# Patient Record
Sex: Female | Born: 1970 | Race: White | Hispanic: No | Marital: Married | Smoking: Never smoker
Health system: Southern US, Community
[De-identification: ages and names within clinical notes are randomized; demographics above are authoritative.]

## PROBLEM LIST (undated history)

## (undated) DIAGNOSIS — G43909 Migraine, unspecified, not intractable, without status migrainosus: Secondary | ICD-10-CM

## (undated) HISTORY — DX: Migraine, unspecified, not intractable, without status migrainosus: G43.909

---

## 1999-09-01 ENCOUNTER — Emergency Department (HOSPITAL_COMMUNITY): Admission: EM | Admit: 1999-09-01 | Discharge: 1999-09-01 | Payer: Self-pay | Admitting: Emergency Medicine

## 1999-09-05 ENCOUNTER — Encounter: Payer: Self-pay | Admitting: Family Medicine

## 1999-09-05 ENCOUNTER — Encounter: Admission: RE | Admit: 1999-09-05 | Discharge: 1999-09-05 | Payer: Self-pay | Admitting: Family Medicine

## 2000-07-29 ENCOUNTER — Other Ambulatory Visit: Admission: RE | Admit: 2000-07-29 | Discharge: 2000-07-29 | Payer: Self-pay | Admitting: Obstetrics & Gynecology

## 2000-09-17 ENCOUNTER — Other Ambulatory Visit: Admission: RE | Admit: 2000-09-17 | Discharge: 2000-09-17 | Payer: Self-pay | Admitting: *Deleted

## 2000-09-17 ENCOUNTER — Encounter (INDEPENDENT_AMBULATORY_CARE_PROVIDER_SITE_OTHER): Payer: Self-pay

## 2000-09-21 HISTORY — PX: LEEP: SHX91

## 2000-10-19 ENCOUNTER — Other Ambulatory Visit: Admission: RE | Admit: 2000-10-19 | Discharge: 2000-10-19 | Payer: Self-pay | Admitting: Obstetrics & Gynecology

## 2000-10-19 ENCOUNTER — Encounter (INDEPENDENT_AMBULATORY_CARE_PROVIDER_SITE_OTHER): Payer: Self-pay | Admitting: Specialist

## 2001-02-16 ENCOUNTER — Other Ambulatory Visit: Admission: RE | Admit: 2001-02-16 | Discharge: 2001-02-16 | Payer: Self-pay | Admitting: Obstetrics & Gynecology

## 2001-03-14 ENCOUNTER — Emergency Department (HOSPITAL_COMMUNITY): Admission: EM | Admit: 2001-03-14 | Discharge: 2001-03-14 | Payer: Self-pay | Admitting: Emergency Medicine

## 2001-08-16 ENCOUNTER — Other Ambulatory Visit: Admission: RE | Admit: 2001-08-16 | Discharge: 2001-08-16 | Payer: Self-pay | Admitting: Obstetrics & Gynecology

## 2002-06-02 ENCOUNTER — Other Ambulatory Visit: Admission: RE | Admit: 2002-06-02 | Discharge: 2002-06-02 | Payer: Self-pay | Admitting: Obstetrics & Gynecology

## 2003-02-02 ENCOUNTER — Other Ambulatory Visit: Admission: RE | Admit: 2003-02-02 | Discharge: 2003-02-02 | Payer: Self-pay | Admitting: Obstetrics and Gynecology

## 2004-02-25 ENCOUNTER — Other Ambulatory Visit: Admission: RE | Admit: 2004-02-25 | Discharge: 2004-02-25 | Payer: Self-pay | Admitting: Obstetrics & Gynecology

## 2005-01-20 ENCOUNTER — Encounter: Admission: RE | Admit: 2005-01-20 | Discharge: 2005-01-20 | Payer: Self-pay | Admitting: Family Medicine

## 2007-10-07 ENCOUNTER — Inpatient Hospital Stay (HOSPITAL_COMMUNITY): Admission: AD | Admit: 2007-10-07 | Discharge: 2007-10-09 | Payer: Self-pay | Admitting: Obstetrics & Gynecology

## 2007-10-07 ENCOUNTER — Encounter (INDEPENDENT_AMBULATORY_CARE_PROVIDER_SITE_OTHER): Payer: Self-pay | Admitting: Obstetrics & Gynecology

## 2007-10-10 ENCOUNTER — Encounter: Admission: RE | Admit: 2007-10-10 | Discharge: 2007-11-09 | Payer: Self-pay | Admitting: Obstetrics & Gynecology

## 2007-11-10 ENCOUNTER — Encounter: Admission: RE | Admit: 2007-11-10 | Discharge: 2007-12-07 | Payer: Self-pay | Admitting: Obstetrics & Gynecology

## 2007-12-08 ENCOUNTER — Encounter: Admission: RE | Admit: 2007-12-08 | Discharge: 2008-01-07 | Payer: Self-pay | Admitting: Obstetrics & Gynecology

## 2008-01-08 ENCOUNTER — Encounter: Admission: RE | Admit: 2008-01-08 | Discharge: 2008-02-06 | Payer: Self-pay | Admitting: Obstetrics & Gynecology

## 2008-02-07 ENCOUNTER — Encounter: Admission: RE | Admit: 2008-02-07 | Discharge: 2008-02-16 | Payer: Self-pay | Admitting: Obstetrics & Gynecology

## 2008-07-20 ENCOUNTER — Encounter: Admission: RE | Admit: 2008-07-20 | Discharge: 2008-07-20 | Payer: Self-pay | Admitting: Family Medicine

## 2009-06-18 ENCOUNTER — Ambulatory Visit (HOSPITAL_COMMUNITY): Admission: RE | Admit: 2009-06-18 | Discharge: 2009-06-18 | Payer: Self-pay | Admitting: Obstetrics & Gynecology

## 2009-06-19 ENCOUNTER — Ambulatory Visit (HOSPITAL_COMMUNITY): Admission: RE | Admit: 2009-06-19 | Discharge: 2009-06-19 | Payer: Self-pay | Admitting: Obstetrics & Gynecology

## 2009-07-30 ENCOUNTER — Inpatient Hospital Stay (HOSPITAL_COMMUNITY): Admission: AD | Admit: 2009-07-30 | Discharge: 2009-08-01 | Payer: Self-pay | Admitting: Obstetrics & Gynecology

## 2010-10-21 ENCOUNTER — Other Ambulatory Visit: Payer: Self-pay | Admitting: Otolaryngology

## 2010-10-21 DIAGNOSIS — J329 Chronic sinusitis, unspecified: Secondary | ICD-10-CM

## 2010-10-22 ENCOUNTER — Other Ambulatory Visit: Payer: Self-pay

## 2010-10-23 ENCOUNTER — Other Ambulatory Visit: Payer: Self-pay

## 2010-10-23 ENCOUNTER — Ambulatory Visit
Admission: RE | Admit: 2010-10-23 | Discharge: 2010-10-23 | Disposition: A | Discharge status: Home / Self Care | Payer: BC Managed Care – PPO | Source: Ambulatory Visit | Attending: Otolaryngology | Admitting: Otolaryngology

## 2010-10-23 DIAGNOSIS — J329 Chronic sinusitis, unspecified: Secondary | ICD-10-CM

## 2010-12-24 LAB — CBC
Hemoglobin: 11.1 g/dL — ABNORMAL LOW (ref 12.0–15.0)
MCHC: 34.2 g/dL (ref 30.0–36.0)
MCV: 98.7 fL (ref 78.0–100.0)
Platelets: 142 10*3/uL — ABNORMAL LOW (ref 150–400)
Platelets: 149 10*3/uL — ABNORMAL LOW (ref 150–400)
RDW: 13.8 % (ref 11.5–15.5)
WBC: 12.1 10*3/uL — ABNORMAL HIGH (ref 4.0–10.5)
WBC: 12.4 10*3/uL — ABNORMAL HIGH (ref 4.0–10.5)

## 2010-12-24 LAB — RPR: RPR Ser Ql: NONREACTIVE

## 2011-06-12 LAB — CBC
HCT: 29.3 — ABNORMAL LOW
Hemoglobin: 10.2 — ABNORMAL LOW
MCHC: 34.8
MCV: 95.7
RBC: 4.16
RDW: 13.1
WBC: 16.2 — ABNORMAL HIGH

## 2011-06-12 LAB — COMPREHENSIVE METABOLIC PANEL
ALT: 12
AST: 21
Albumin: 3.3 — ABNORMAL LOW
Alkaline Phosphatase: 95
Chloride: 102
GFR calc Af Amer: >60
Potassium: 4.1
Total Bilirubin: 1.9 — ABNORMAL HIGH

## 2011-06-12 LAB — CCBB MATERNAL DONOR DRAW

## 2011-06-12 LAB — RPR: RPR Ser Ql: NONREACTIVE

## 2011-11-13 ENCOUNTER — Other Ambulatory Visit: Payer: Self-pay | Admitting: Family Medicine

## 2011-11-13 ENCOUNTER — Ambulatory Visit
Admission: RE | Admit: 2011-11-13 | Discharge: 2011-11-13 | Disposition: A | Discharge status: Home / Self Care | Payer: BC Managed Care – PPO | Source: Ambulatory Visit | Attending: Family Medicine | Admitting: Family Medicine

## 2011-11-13 DIAGNOSIS — R0602 Shortness of breath: Secondary | ICD-10-CM

## 2011-11-13 MED ORDER — IOHEXOL 350 MG/ML SOLN
100.0000 mL | Freq: Once | INTRAVENOUS | Status: AC | PRN
  Administered 2011-11-13: 100 mL via INTRAVENOUS

## 2013-03-02 ENCOUNTER — Other Ambulatory Visit: Payer: Self-pay | Admitting: Dermatology

## 2014-03-05 ENCOUNTER — Other Ambulatory Visit: Payer: Self-pay | Admitting: *Deleted

## 2014-03-05 DIAGNOSIS — R002 Palpitations: Secondary | ICD-10-CM

## 2014-03-07 ENCOUNTER — Other Ambulatory Visit: Payer: Self-pay | Admitting: *Deleted

## 2014-03-07 DIAGNOSIS — R002 Palpitations: Secondary | ICD-10-CM

## 2014-03-14 ENCOUNTER — Encounter: Payer: Self-pay | Admitting: *Deleted

## 2014-03-14 ENCOUNTER — Encounter (INDEPENDENT_AMBULATORY_CARE_PROVIDER_SITE_OTHER): Payer: BC Managed Care – PPO

## 2014-03-14 DIAGNOSIS — R002 Palpitations: Secondary | ICD-10-CM

## 2014-03-14 NOTE — Progress Notes (Signed)
Patient ID: Kristin Henderson, female   DOB: 02/06/1971, 43 y.o.   MRN: [id number] ECardio 24 hour holter monitor applied to patient.

## 2014-07-06 ENCOUNTER — Other Ambulatory Visit: Payer: Self-pay

## 2015-10-01 ENCOUNTER — Ambulatory Visit
Admission: RE | Admit: 2015-10-01 | Discharge: 2015-10-01 | Disposition: A | Discharge status: Home / Self Care | Payer: BLUE CROSS/BLUE SHIELD | Source: Ambulatory Visit | Attending: Family Medicine | Admitting: Family Medicine

## 2015-10-01 ENCOUNTER — Other Ambulatory Visit: Payer: Self-pay | Admitting: Family Medicine

## 2015-10-01 DIAGNOSIS — M25561 Pain in right knee: Secondary | ICD-10-CM

## 2015-10-01 DIAGNOSIS — M25562 Pain in left knee: Principal | ICD-10-CM

## 2017-08-03 IMAGING — CR DG KNEE 1-2V*L*
2 series · 2 of 2 positions shown · non-contrast
Comparison: None.

CLINICAL DATA: Chronic pain.  No known injury.  Initial evaluation.

EXAM:
LEFT KNEE - 1-2 VIEW

[w knee ap left]
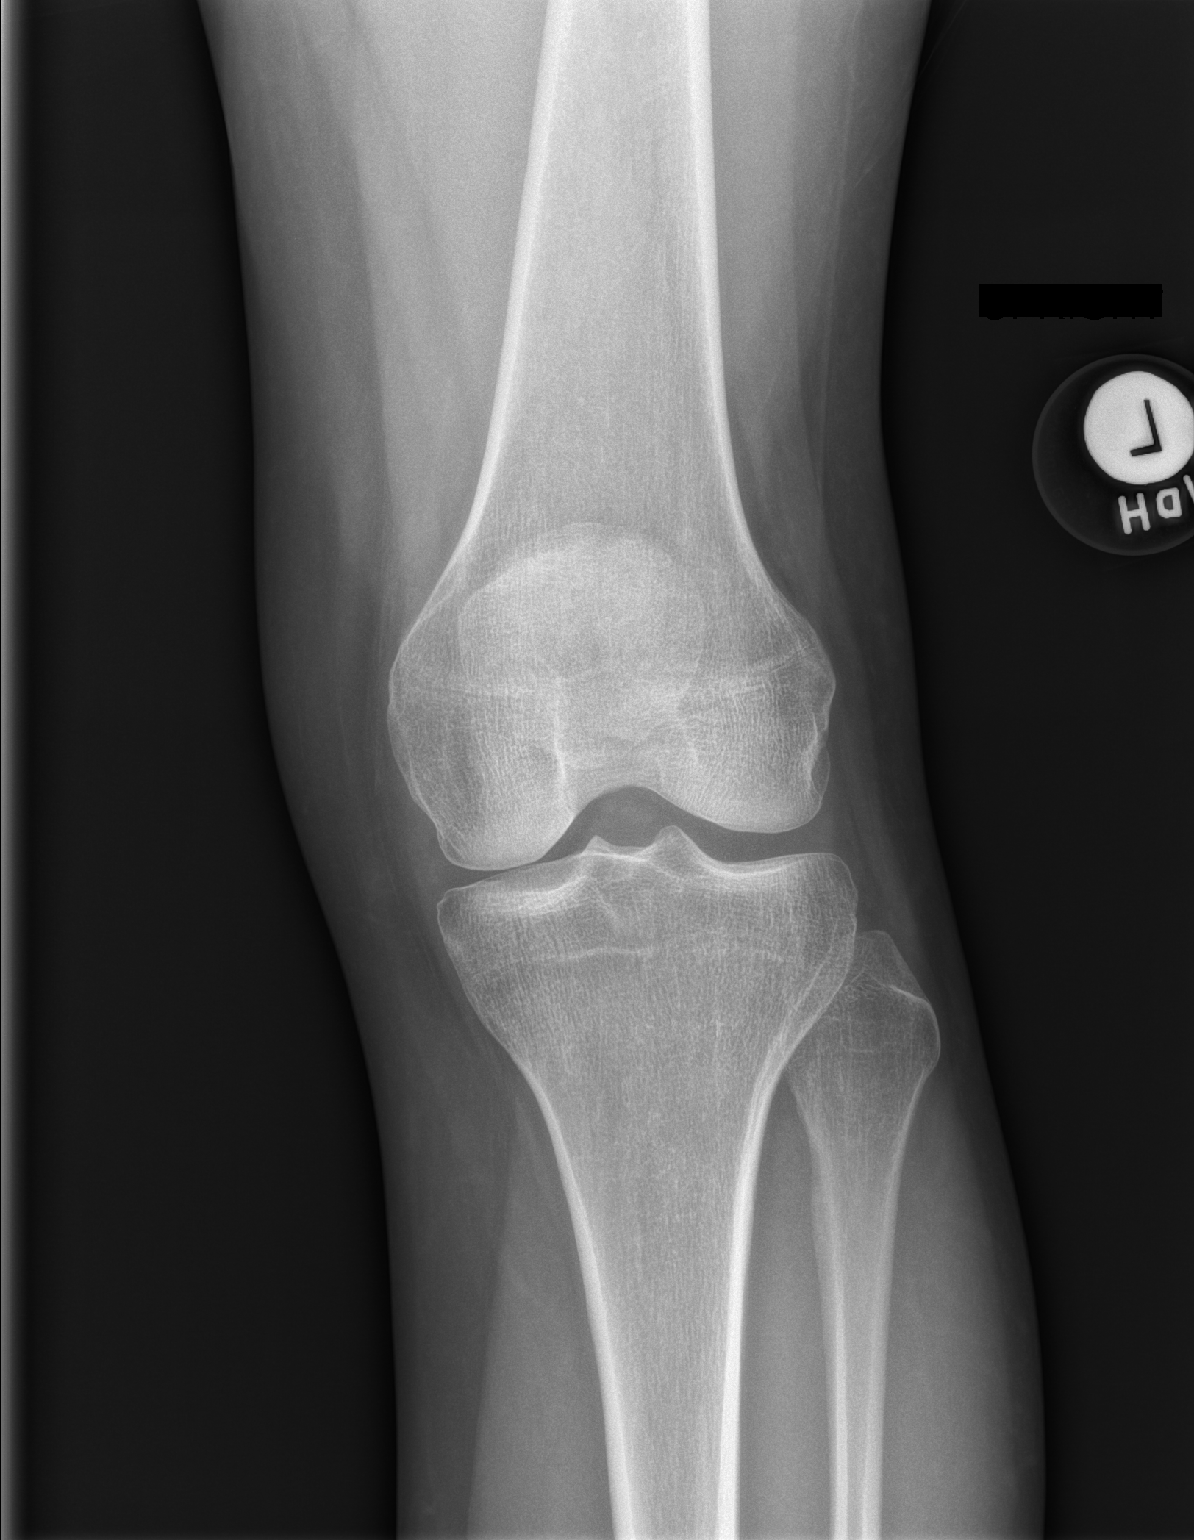

[w knee lat left]
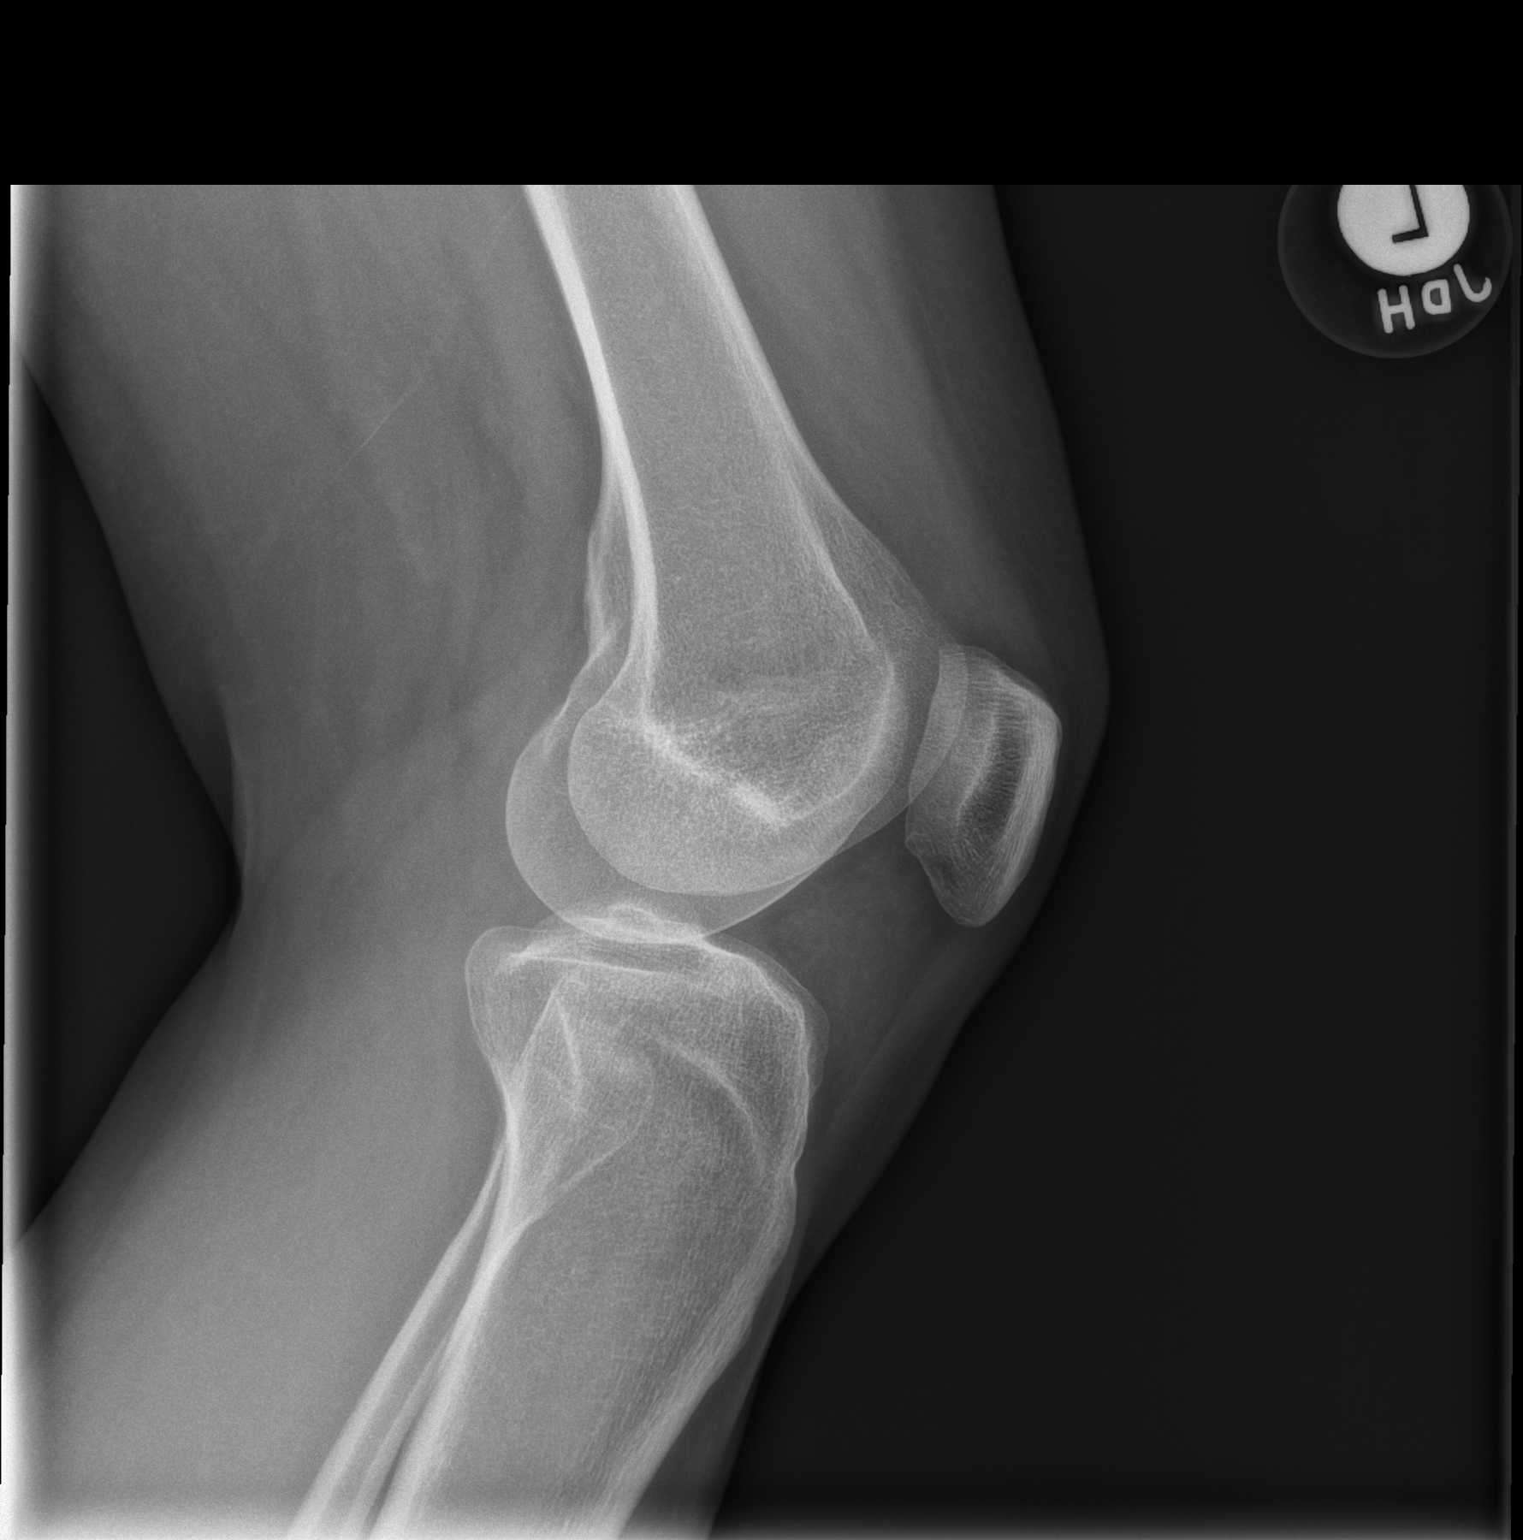

[2 of 2 positions shown; findings below may reference images not displayed]

FINDINGS: No acute bony or joint abnormality identified. No evidence of
fracture or dislocation.
IMPRESSION: No acute abnormality.

## 2018-11-03 ENCOUNTER — Encounter: Payer: Self-pay | Admitting: Neurology

## 2018-12-26 ENCOUNTER — Ambulatory Visit: Payer: BLUE CROSS/BLUE SHIELD | Admitting: Neurology

## 2019-03-06 ENCOUNTER — Other Ambulatory Visit (INDEPENDENT_AMBULATORY_CARE_PROVIDER_SITE_OTHER): Payer: BC Managed Care – PPO

## 2019-03-06 ENCOUNTER — Other Ambulatory Visit: Payer: Self-pay

## 2019-03-06 ENCOUNTER — Ambulatory Visit (INDEPENDENT_AMBULATORY_CARE_PROVIDER_SITE_OTHER): Payer: BC Managed Care – PPO | Admitting: Neurology

## 2019-03-06 ENCOUNTER — Encounter: Payer: Self-pay | Admitting: Neurology

## 2019-03-06 VITALS — BP 122/72 | HR 79 | Ht 68.0 in | Wt 135.0 lb

## 2019-03-06 DIAGNOSIS — G43009 Migraine without aura, not intractable, without status migrainosus: Secondary | ICD-10-CM

## 2019-03-06 DIAGNOSIS — G4485 Primary stabbing headache: Secondary | ICD-10-CM | POA: Diagnosis not present

## 2019-03-06 LAB — SEDIMENTATION RATE: Sed Rate: 6 mm/h (ref 0–20)

## 2019-03-06 NOTE — Progress Notes (Signed)
[facility] Clinic Note - Initial Visit   Date: 03/06/19  Kristin Henderson MRN: [id number] DOB: 1971/04/12   Dear Dr. Orland Mustard:  Thank you for your kind referral of Kristin Henderson for consultation of headaches. Although her history is well known to you, please allow [place] to reiterate it for the purpose of our medical record. The patient was accompanied to the clinic by self.   History of Present Illness: Kristin Henderson is a 48 y.o. right-handed female with history of migraines, GERD, and borderline hyperlipidemia presenting for evaluation of left-sided headaches. Starting in early 2020, she began having very transient sharp pain of the left temporal region.  This occurs 1-2 times per day and lasts a few seconds.  There is no associated nausea, vomiting, light sensitivity, eye pain, or vision changes. She has not identified any specific triggers.    She had migraines since the age of 61.  Intensity and frequency of migraines has improved over the past few years. Migraines usually occur about once per month, 1-2 days and alleviated by OTC medications.   Her mother and sister also have history of migraine.    Past Medical History:  Diagnosis Date  . Migraines     Past Surgical History:  Procedure Laterality Date  . LEEP  2002     Medications:  Outpatient Encounter Medications as of 03/06/2019  Medication Sig  . ferrous sulfate 325 (65 FE) MG tablet Take 325 mg by mouth daily with breakfast.  . Ibuprofen (ADVIL) 200 MG CAPS Take 200 mg by mouth as needed.  . Multiple Vitamin (MULTIVITAMIN) capsule Take 1 capsule by mouth daily.   No facility-administered encounter medications on file as of 03/06/2019.     Allergies: No Known Allergies  Family History: Family History  Problem Relation Age of Onset  . Ulcers Father   . Skin cancer Father   . Migraines Mother   . Migraines Sister     Social History: Social History   Tobacco Use  . Smoking  status: Never Smoker  . Smokeless tobacco: Never Used  Substance Use Topics  . Alcohol use: Yes    Comment: rare  . Drug use: Never   Social History   Social History Corporate investment banker      Lives with husband and two kids      No steps in home      Highest level of edu- [employer]       Review of Systems:  CONSTITUTIONAL: No fevers, chills, night sweats, or weight loss.   EYES: No visual changes or eye pain ENT: No hearing changes.  No history of nose bleeds.   RESPIRATORY: No cough, wheezing and shortness of breath.   CARDIOVASCULAR: Negative for chest pain, and palpitations.   GI: Negative for abdominal discomfort, blood in stools or black stools.  No recent change in bowel habits.   GU:  No history of incontinence.   MUSCLOSKELETAL: No history of joint pain or swelling.  No myalgias.   SKIN: Negative for lesions, rash, and itching.   HEMATOLOGY/ONCOLOGY: Negative for prolonged bleeding, bruising easily, and swollen nodes.  No history of cancer.   ENDOCRINE: Negative for cold or heat intolerance, polydipsia or goiter.   PSYCH:  No depression or anxiety symptoms.   NEURO: As Above.   Vital Signs:  BP 122/72   Pulse 79   Ht 5' 8"  (1.727 m)   Wt 135 lb (61.2 kg)   SpO2 99%  BMI 20.53 kg/m    General Medical Exam:   General:  Well appearing, comfortable.   Eyes/ENT: see cranial nerve examination.  No temporal tenderness or ropiness of the temporal artery bilaterally. Neck:   No carotid bruits. Respiratory:  Clear to auscultation, good air entry bilaterally.   Cardiac:  Regular rate and rhythm, no murmur.   Extremities:  No deformities, edema, or skin discoloration.  Skin:  No rashes or lesions.  Neurological Exam: MENTAL STATUS including orientation to time, place, person, recent and remote memory, attention span and concentration, language, and fund of knowledge is normal.  Speech is not dysarthric.  CRANIAL NERVES: II:  No visual field  defects.  Unremarkable fundi.   III-IV-VI: Pupils equal round and reactive to light.  Normal conjugate, extra-ocular eye movements in all directions of gaze.  No nystagmus.  No ptosis.   V:  Normal facial sensation.    VII:  Normal facial symmetry and movements.   VIII:  Normal hearing and vestibular function.   IX-X:  Normal palatal movement.   XI:  Normal shoulder shrug and head rotation.   XII:  Normal tongue strength and range of motion, no deviation or fasciculation.  MOTOR:  Motor strength is 5/5 throughout. No atrophy, fasciculations or abnormal movements.  No pronator drift.   MSRs:  Right        Left                  brachioradialis 2+  2+  biceps 2+  2+  triceps 2+  2+  patellar 2+  2+  ankle jerk 2+  2+  Hoffman no  no  plantar response down  down   SENSORY:  Normal and symmetric perception of light touch, pinprick, vibration, and proprioception.  Romberg's sign absent.   COORDINATION/GAIT: Normal finger-to- nose-finger and heel-to-shin.  Intact rapid alternating movements bilaterally.  Able to rise from a chair without using arms.  Gait narrow based and stable. Tandem and stressed gait intact.    IMPRESSION: Primary stabbing headaches.  Normal neurological exam makes worrisome pathology very low as a cause of her headaches. Pain is very brief, lasting a few seconds and does not occur frequently enough to start daily medications.  Check ESR to screen for temporal arteritis, but overall suspicion is very low.  She has no temporal tenderness or abnormality of the temporal artery.  Patient was reassured and told to contact my office, if symptoms progress or she developed new neurological symptoms associated with headaches.   Episodic migraine without aura.  Well-controlled and responsive to OTC medications  Return to clinic in 4 months.    Thank you for allowing me to participate in patient's care.  If I can answer any additional questions, I would be pleased to do so.     Sincerely,    Caylor Cerino K. Posey Pronto, DO

## 2019-03-06 NOTE — Patient Instructions (Signed)
We will check your lab and let you know the results  If your headaches become more frequent or bothersome, please call my office.  Return to clinic in 4 months

## 2019-06-01 ENCOUNTER — Encounter: Payer: Self-pay | Admitting: Neurology

## 2019-06-05 ENCOUNTER — Ambulatory Visit (INDEPENDENT_AMBULATORY_CARE_PROVIDER_SITE_OTHER): Payer: BC Managed Care – PPO | Admitting: Neurology

## 2019-06-05 DIAGNOSIS — Z9119 Patient's noncompliance with other medical treatment and regimen: Secondary | ICD-10-CM

## 2024-08-15 ENCOUNTER — Other Ambulatory Visit: Payer: Self-pay | Admitting: Gastroenterology

## 2024-08-15 DIAGNOSIS — R110 Nausea: Secondary | ICD-10-CM

## 2024-08-24 ENCOUNTER — Ambulatory Visit
Admission: RE | Admit: 2024-08-24 | Discharge: 2024-08-24 | Disposition: A | Source: Ambulatory Visit | Attending: Gastroenterology | Admitting: Gastroenterology

## 2024-08-24 DIAGNOSIS — R110 Nausea: Secondary | ICD-10-CM
